# Patient Record
Sex: Male | Born: 1990 | Race: Black or African American | Hispanic: No | Marital: Single | Smoking: Current every day smoker
Health system: Southern US, Community
[De-identification: ages and names within clinical notes are randomized; demographics above are authoritative.]

---

## 2017-12-15 ENCOUNTER — Ambulatory Visit (HOSPITAL_COMMUNITY)
Admission: EM | Admit: 2017-12-15 | Discharge: 2017-12-15 | Disposition: A | Discharge status: Home / Self Care | Payer: Self-pay | Attending: Family Medicine | Admitting: Family Medicine

## 2017-12-15 ENCOUNTER — Encounter (HOSPITAL_COMMUNITY): Payer: Self-pay | Admitting: *Deleted

## 2017-12-15 DIAGNOSIS — F17210 Nicotine dependence, cigarettes, uncomplicated: Secondary | ICD-10-CM | POA: Insufficient documentation

## 2017-12-15 DIAGNOSIS — Z113 Encounter for screening for infections with a predominantly sexual mode of transmission: Secondary | ICD-10-CM

## 2017-12-15 DIAGNOSIS — Z8249 Family history of ischemic heart disease and other diseases of the circulatory system: Secondary | ICD-10-CM | POA: Insufficient documentation

## 2017-12-15 DIAGNOSIS — Z202 Contact with and (suspected) exposure to infections with a predominantly sexual mode of transmission: Secondary | ICD-10-CM | POA: Insufficient documentation

## 2017-12-15 MED ORDER — AZITHROMYCIN 250 MG PO TABS
1000.0000 mg | ORAL_TABLET | Freq: Once | ORAL | Status: AC
Start: 2017-12-15 — End: 2017-12-15
  Administered 2017-12-15: 1000 mg via ORAL

## 2017-12-15 MED ORDER — AZITHROMYCIN 250 MG PO TABS
ORAL_TABLET | ORAL | Status: AC
  Filled 2017-12-15: qty 4

## 2017-12-15 NOTE — ED Provider Notes (Signed)
[facility]    CSN: [id number] Arrival date & time: 12/15/17  1625     History   Chief Complaint Chief Complaint  Patient presents with  . Exposure to STD    HPI Alex Cunningham is a 27 y.o. male no significant past medical history presenting today for evaluation of STDs.  Patient states that his partner tested positive for chlamydia.  He denies any symptoms at this time.  Denies penile discharge, dysuria, increased frequency, abdominal pain, rashes or lesions.  He does note that approximately 2 months ago he had slight dysuria with urination that resolved on its own.  HPI  History reviewed. No pertinent past medical history.  There are no active problems to display for this patient.   History reviewed. No pertinent surgical history.     Home Medications    Prior to Admission medications   Not on File    Family History Family History  Problem Relation Age of Onset  . Hypertension Mother   . Hypertension Maternal Grandmother   . Hypertension Maternal Aunt     Social History Social History   Tobacco Use  . Smoking status: Current Every Day Smoker    Types: Cigarettes  . Smokeless tobacco: Never Used  Substance Use Topics  . Alcohol use: Yes    Comment: occasionally  . Drug use: Never     Allergies   Patient has no known allergies.   Review of Systems Review of Systems  Constitutional: Negative for fever.  HENT: Negative for sore throat.   Respiratory: Negative for shortness of breath.   Cardiovascular: Negative for chest pain.  Gastrointestinal: Negative for abdominal pain, nausea and vomiting.  Genitourinary: Negative for difficulty urinating, discharge, dysuria, frequency, penile pain, penile swelling, scrotal swelling and testicular pain.  Skin: Negative for rash.  Neurological: Negative for dizziness, light-headedness and headaches.     Physical Exam Triage Vital Signs ED Triage Vitals  Enc Vitals Group     BP 12/15/17 1656  138/78     Pulse Rate 12/15/17 1656 (!) 53     Resp 12/15/17 1656 14     Temp 12/15/17 1656 98.4 F (36.9 C)     Temp Source 12/15/17 1656 Oral     SpO2 12/15/17 1656 100 %     Weight --      Height --      Head Circumference --      Peak Flow --      Pain Score 12/15/17 1657 0     Pain Loc --      Pain Edu? --      Excl. in [place]? --    No data found.  Updated Vital Signs BP 138/78   Pulse (!) 53   Temp 98.4 F (36.9 C) (Oral)   Resp 14   SpO2 100%   Visual Acuity Right Eye Distance:   Left Eye Distance:   Bilateral Distance:    Right Eye Near:   Left Eye Near:    Bilateral Near:     Physical Exam  Constitutional: He is oriented to person, place, and time. He appears well-developed and well-nourished.  No acute distress  HENT:  Head: Normocephalic and atraumatic.  Nose: Nose normal.  Eyes: Conjunctivae are normal.  Neck: Neck supple.  Cardiovascular: Normal rate.  Pulmonary/Chest: Effort normal. No respiratory distress.  Abdominal: He exhibits no distension.  Musculoskeletal: Normal range of motion.  Neurological: He is alert and oriented to person, place, and time.  Skin: Skin is warm and dry.  Psychiatric: He has a normal mood and affect.  Nursing note and vitals reviewed.    UC Treatments / Results  Labs (all labs ordered are listed, but only abnormal results are displayed) Labs Reviewed  URINE CYTOLOGY ANCILLARY ONLY    EKG None  Radiology No results found.  Procedures Procedures (including critical care time)  Medications Ordered in UC Medications  azithromycin (ZITHROMAX) tablet 1,000 mg (has no administration in time range)    Initial Impression / Assessment and Plan / UC Course  I have reviewed the triage vital signs and the nursing notes.  Pertinent labs & imaging results that were available during my care of the patient were reviewed by me and considered in my medical decision making (see chart for details).     Urine cytology  obtained, will send off to check for STDs.  We will go ahead and empirically treat for chlamydia today.  Advised to refrain sexual activity for 7 days while infection being cleared.Discussed strict return precautions. Patient verbalized understanding and is agreeable with plan.  Final Clinical Impressions(s) / UC Diagnoses   Final diagnoses:  STD exposure     Discharge Instructions     We have treated you today for chlamydia, with azithromycin. Please refrain from sexual activity for 7 days while medicine is clearing infection.  We are testing you for Gonorrhea, Chlamydia and Trichomonas. We will call you if anything is positive and let you know if you require any further treatment. Please inform partner of any positive results.  Please return if symptoms not improving with treatment, development of fever, nausea, vomiting, abdominal pain, scrotal pain.    ED Prescriptions    None     Controlled Substance Prescriptions [employer] Controlled Substance Registry consulted? Not Applicable   Lew Dawes, [place] 12/15/17 1717

## 2017-12-15 NOTE — Discharge Instructions (Signed)
We have treated you today for chlamydia, with azithromycin. Please refrain from sexual activity for 7 days while medicine is clearing infection. ° °We are testing you for Gonorrhea, Chlamydia and Trichomonas. We will call you if anything is positive and let you know if you require any further treatment. Please inform partner of any positive results. ° °Please return if symptoms not improving with treatment, development of fever, nausea, vomiting, abdominal pain, scrotal pain. °

## 2017-12-15 NOTE — ED Triage Notes (Signed)
Reports was told he was possibly exposed to chlamydia.  Denies any sxs.

## 2017-12-18 ENCOUNTER — Telehealth (HOSPITAL_COMMUNITY): Payer: Self-pay

## 2017-12-18 LAB — URINE CYTOLOGY ANCILLARY ONLY
Chlamydia: POSITIVE — AB
Neisseria Gonorrhea: NEGATIVE
Trichomonas: NEGATIVE

## 2017-12-18 NOTE — Telephone Encounter (Signed)
Chlamydia is positive.  This was treated at the urgent care visit with po zithromax 1g.  Pt contacted and made aware of results. Educated pt to please refrain from sexual intercourse for 7 days to give the medicine time to work.  Sexual partners need to be notified and tested/treated.  Condoms may reduce risk of reinfection.  Recheck or followup with PCP for further evaluation if symptoms are not improving.  [facility] notified 

## 2017-12-19 LAB — URINE CYTOLOGY ANCILLARY ONLY
Bacterial vaginitis: NEGATIVE
Candida vaginitis: NEGATIVE

## 2019-04-29 ENCOUNTER — Ambulatory Visit (HOSPITAL_COMMUNITY)
Admission: EM | Admit: 2019-04-29 | Discharge: 2019-04-29 | Disposition: A | Discharge status: Home / Self Care | Payer: 59 | Attending: Family Medicine | Admitting: Family Medicine

## 2019-04-29 ENCOUNTER — Other Ambulatory Visit: Payer: Self-pay

## 2019-04-29 ENCOUNTER — Encounter (HOSPITAL_COMMUNITY): Payer: Self-pay

## 2019-04-29 DIAGNOSIS — R300 Dysuria: Secondary | ICD-10-CM | POA: Diagnosis not present

## 2019-04-29 LAB — POCT URINALYSIS DIP (DEVICE)
Bilirubin Urine: NEGATIVE
Glucose, UA: NEGATIVE mg/dL
Hgb urine dipstick: NEGATIVE
Ketones, ur: NEGATIVE mg/dL
Leukocytes,Ua: NEGATIVE
Nitrite: NEGATIVE
Protein, ur: NEGATIVE mg/dL
Specific Gravity, Urine: 1.025 (ref 1.005–1.030)
Urobilinogen, UA: 0.2 mg/dL (ref 0.0–1.0)
pH: 6 (ref 5.0–8.0)

## 2019-04-29 MED ORDER — AZITHROMYCIN 250 MG PO TABS
ORAL_TABLET | ORAL | Status: AC
  Filled 2019-04-29: qty 4

## 2019-04-29 MED ORDER — CEFTRIAXONE SODIUM 250 MG IJ SOLR
250.0000 mg | Freq: Once | INTRAMUSCULAR | Status: AC
Start: 2019-04-29 — End: 2019-04-29
  Administered 2019-04-29: 250 mg via INTRAMUSCULAR

## 2019-04-29 MED ORDER — LIDOCAINE HCL (PF) 1 % IJ SOLN
INTRAMUSCULAR | Status: AC
  Filled 2019-04-29: qty 2

## 2019-04-29 MED ORDER — CEFTRIAXONE SODIUM 250 MG IJ SOLR
INTRAMUSCULAR | Status: AC
  Filled 2019-04-29: qty 250

## 2019-04-29 MED ORDER — AZITHROMYCIN 250 MG PO TABS
1000.0000 mg | ORAL_TABLET | Freq: Once | ORAL | Status: AC
  Administered 2019-04-29: 1000 mg via ORAL

## 2019-04-29 NOTE — ED Triage Notes (Signed)
Pt states he has had burning with urination the past few days.

## 2019-04-29 NOTE — Discharge Instructions (Addendum)
Your urine did not show any infection.  We will test you for STDs and call you with any positive results.  Treated you today with medicine based on symptoms.  Follow up as needed for continued or worsening symptoms

## 2019-04-29 NOTE — ED Provider Notes (Signed)
[place]    CSN: [id number] Arrival date & time: 04/29/19  8416      History   Chief Complaint Chief Complaint  Patient presents with  . Urinary Tract Infection    HPI Moris Ratchford is a 28 y.o. male.   Patient is a 28 year old male who presents today with dysuria.  This is been intermittent over the past 3 days.  Currently sexually active, unprotected.  History of chlamydia.  Denies any abdominal pain, penile discharge, testicle pain, testicle swelling or rashes.        History reviewed. No pertinent past medical history.  There are no active problems to display for this patient.   History reviewed. No pertinent surgical history.     Home Medications    Prior to Admission medications   Not on File    Family History Family History  Problem Relation Age of Onset  . Hypertension Mother   . Hypertension Maternal Grandmother   . Hypertension Maternal Aunt     Social History Social History   Tobacco Use  . Smoking status: Current Every Day Smoker    Types: Cigarettes  . Smokeless tobacco: Never Used  Substance Use Topics  . Alcohol use: Yes    Comment: occasionally  . Drug use: Never     Allergies   Patient has no known allergies.   Review of Systems Review of Systems  Genitourinary: Negative for decreased urine volume, difficulty urinating, discharge, dysuria, enuresis, flank pain, frequency, genital sores, hematuria, penile pain, penile swelling, scrotal swelling, testicular pain and urgency.     Physical Exam Triage Vital Signs ED Triage Vitals  Enc Vitals Group     BP 04/29/19 0822 135/88     Pulse Rate 04/29/19 0822 (!) 58     Resp 04/29/19 0822 16     Temp 04/29/19 0822 97.8 F (36.6 C)     Temp Source 04/29/19 0822 Temporal     SpO2 04/29/19 0822 100 %     Weight --      Height --      Head Circumference --      Peak Flow --      Pain Score 04/29/19 0827 5     Pain Loc --      Pain Edu? --      Excl. in [place]? --     No data found.  Updated Vital Signs BP 135/88 (BP Location: Left Arm)   Pulse (!) 58   Temp 97.8 F (36.6 C) (Temporal)   Resp 16   SpO2 100%   Visual Acuity Right Eye Distance:   Left Eye Distance:   Bilateral Distance:    Right Eye Near:   Left Eye Near:    Bilateral Near:     Physical Exam Vitals signs and nursing note reviewed.  Constitutional:      Appearance: Normal appearance.  HENT:     Head: Normocephalic and atraumatic.     Nose: Nose normal.  Eyes:     Conjunctiva/sclera: Conjunctivae normal.  Neck:     Musculoskeletal: Normal range of motion.  Pulmonary:     Effort: Pulmonary effort is normal.  Abdominal:     Palpations: Abdomen is soft.     Tenderness: There is no abdominal tenderness.  Genitourinary:    Penis: Normal.   Musculoskeletal: Normal range of motion.  Skin:    General: Skin is warm and dry.  Neurological:     Mental Status: He is alert.  Psychiatric:        Mood and Affect: Mood normal.      UC Treatments / Results  Labs (all labs ordered are listed, but only abnormal results are displayed) Labs Reviewed  POCT URINALYSIS DIP (DEVICE)  CYTOLOGY, (ORAL, ANAL, URETHRAL) ANCILLARY ONLY    EKG   Radiology No results found.  Procedures Procedures (including critical care time)  Medications Ordered in UC Medications  cefTRIAXone (ROCEPHIN) injection 250 mg (has no administration in time range)  azithromycin (ZITHROMAX) tablet 1,000 mg (has no administration in time range)    Initial Impression / Assessment and Plan / UC Course  I have reviewed the triage vital signs and the nursing notes.  Pertinent labs & imaging results that were available during my care of the patient were reviewed by me and considered in my medical decision making (see chart for details).     Dysuria- urine negative for infection Sending swab for STD testing.  Treating prophylactic  based on symptoms.  Final Clinical Impressions(s) / UC  Diagnoses   Final diagnoses:  Dysuria     Discharge Instructions     Your urine did not show any infection.  We will test you for STDs and call you with any positive results.  Treated you today with medicine based on symptoms.  Follow up as needed for continued or worsening symptoms     ED Prescriptions    None     PDMP not reviewed this encounter.   Janace Aris, NP 04/29/19 [phone]

## 2019-04-30 LAB — CYTOLOGY, (ORAL, ANAL, URETHRAL) ANCILLARY ONLY
Chlamydia: NEGATIVE
Neisseria Gonorrhea: NEGATIVE
Trichomonas: NEGATIVE

## 2020-06-24 ENCOUNTER — Other Ambulatory Visit: Payer: 59

## 2020-06-24 DIAGNOSIS — Z20822 Contact with and (suspected) exposure to covid-19: Secondary | ICD-10-CM

## 2020-06-24 NOTE — Progress Notes (Signed)
nov 

## 2020-06-26 LAB — SARS-COV-2, NAA 2 DAY TAT

## 2020-06-26 LAB — NOVEL CORONAVIRUS, NAA: SARS-CoV-2, NAA: DETECTED — AB

## 2021-11-02 ENCOUNTER — Ambulatory Visit (HOSPITAL_COMMUNITY)
Admission: EM | Admit: 2021-11-02 | Discharge: 2021-11-02 | Disposition: A | Discharge status: Home / Self Care | Payer: 59 | Attending: Internal Medicine | Admitting: Internal Medicine

## 2021-11-02 ENCOUNTER — Other Ambulatory Visit: Payer: Self-pay

## 2021-11-02 ENCOUNTER — Encounter (HOSPITAL_COMMUNITY): Payer: Self-pay | Admitting: *Deleted

## 2021-11-02 ENCOUNTER — Ambulatory Visit (INDEPENDENT_AMBULATORY_CARE_PROVIDER_SITE_OTHER): Payer: 59

## 2021-11-02 DIAGNOSIS — M25512 Pain in left shoulder: Secondary | ICD-10-CM | POA: Diagnosis not present

## 2021-11-02 DIAGNOSIS — S4992XA Unspecified injury of left shoulder and upper arm, initial encounter: Secondary | ICD-10-CM

## 2021-11-02 MED ORDER — KETOROLAC TROMETHAMINE 30 MG/ML IJ SOLN
30.0000 mg | Freq: Once | INTRAMUSCULAR | Status: AC
  Administered 2021-11-02: 30 mg via INTRAMUSCULAR

## 2021-11-02 MED ORDER — KETOROLAC TROMETHAMINE 30 MG/ML IJ SOLN
INTRAMUSCULAR | Status: AC
  Filled 2021-11-02: qty 1

## 2021-11-02 NOTE — ED Triage Notes (Signed)
T reports while working on his car yesterday his shoulder dropped down and he has numbness to arm . Pt unable to raise Lt arm above head. ?

## 2021-11-02 NOTE — ED Provider Notes (Signed)
?[facility] ? ? ? ?CSN: [id number] ?Arrival date & time: 11/02/21  1218 ? ? ?  ? ?History   ?Chief Complaint ?Chief Complaint  ?Patient presents with  ? Shoulder Injury  ? ? ?HPI ?Alex Cunningham is a 31 y.o. male.  ? ?Patient presents with left shoulder pain that started yesterday after an injury.  Patient reports that he was working on his car and while twisting one of the bolts, his shoulder " slipped and dropped down" and he had subsequent pain.  Pain is present in the lateral portion as well as the anterior portion of the left shoulder.  Having difficulty with lifting arm up due to pain.  Also reports some mild numbness to area of arm that is affected. ? ? ?Shoulder Injury ? ? ?History reviewed. No pertinent past medical history. ? ?There are no problems to display for this patient. ? ? ?History reviewed. No pertinent surgical history. ? ? ? ? ?Home Medications   ? ?Prior to Admission medications   ?Not on File  ? ? ?Family History ?Family History  ?Problem Relation Age of Onset  ? Hypertension Mother   ? Hypertension Maternal Grandmother   ? Hypertension Maternal Aunt   ? ? ?Social History ?Social History  ? ?Tobacco Use  ? Smoking status: Every Day  ?  Types: Cigarettes  ? Smokeless tobacco: Never  ?Vaping Use  ? Vaping Use: Never used  ?Substance Use Topics  ? Alcohol use: Yes  ?  Comment: occasionally  ? Drug use: Never  ? ? ? ?Allergies   ?Patient has no known allergies. ? ? ?Review of Systems ?Review of Systems ?Per HPI ? ?Physical Exam ?Triage Vital Signs ?ED Triage Vitals  ?Enc Vitals Group  ?   BP 11/02/21 1229 137/90  ?   Pulse Rate 11/02/21 1229 90  ?   Resp 11/02/21 1229 18  ?   Temp 11/02/21 1229 98.1 ?F (36.7 ?C)  ?   Temp src --   ?   SpO2 11/02/21 1229 96 %  ?   Weight --   ?   Height --   ?   Head Circumference --   ?   Peak Flow --   ?   Pain Score 11/02/21 1227 7  ?   Pain Loc --   ?   Pain Edu? --   ?   Excl. in [place]? --   ? ?No data found. ? ?Updated Vital Signs ?BP 137/90   Pulse 90    Temp 98.1 ?F (36.7 ?C)   Resp 18   SpO2 96%  ? ?Visual Acuity ?Right Eye Distance:   ?Left Eye Distance:   ?Bilateral Distance:   ? ?Right Eye Near:   ?Left Eye Near:    ?Bilateral Near:    ? ?Physical Exam ?Constitutional:   ?   General: He is not in acute distress. ?   Appearance: Normal appearance. He is not toxic-appearing or diaphoretic.  ?HENT:  ?   Head: Normocephalic and atraumatic.  ?Eyes:  ?   Extraocular Movements: Extraocular movements intact.  ?   Conjunctiva/sclera: Conjunctivae normal.  ?Pulmonary:  ?   Effort: Pulmonary effort is normal.  ?Musculoskeletal:  ?   Comments: Tenderness to palpation to left lateral shoulder as well as anterior shoulder.  Pain with range of motion at 45 degrees with abduction.  Grip strength is 5/5.  Neurovascular intact.  No obvious swelling, discoloration, abrasions, lacerations noted.  ?Neurological:  ?  General: No focal deficit present.  ?   Mental Status: He is alert and oriented to person, place, and time. Mental status is at baseline.  ?Psychiatric:     ?   Mood and Affect: Mood normal.     ?   Behavior: Behavior normal.     ?   Thought Content: Thought content normal.     ?   Judgment: Judgment normal.  ? ? ? ?UC Treatments / Results  ?Labs ?(all labs ordered are listed, but only abnormal results are displayed) ?Labs Reviewed - No data to display ? ?EKG ? ? ?Radiology ?DG Shoulder Left ? ?Result Date: 11/02/2021 ?CLINICAL DATA:  Shoulder injury EXAM: LEFT SHOULDER - 2+ VIEW COMPARISON:  None Available. FINDINGS: There is no evidence of fracture or dislocation. There is no evidence of arthropathy or other focal bone abnormality. Soft tissues are unremarkable. IMPRESSION: Negative. Electronically Signed   By: Jacqulynn Cadet M.D.   On: 11/02/2021 13:16   ? ?Procedures ?Procedures (including critical care time) ? ?Medications Ordered in UC ?Medications  ?ketorolac (TORADOL) 30 MG/ML injection 30 mg (30 mg Intramuscular Given 11/02/21 1338)  ? ? ?Initial  Impression / Assessment and Plan / UC Course  ?I have reviewed the triage vital signs and the nursing notes. ? ?Pertinent labs & imaging results that were available during my care of the patient were reviewed by me and considered in my medical decision making (see chart for details). ? ?  ? ?Left shoulder x-ray was negative for any acute bony abnormality.  Suspect muscular strain/injury.  Discussed supportive care, over-the-counter pain relievers, ice application.  IM Toradol administered in urgent care today.  Patient to avoid NSAIDs for at least 24 hours following injection.  Patient will need to follow-up with orthopedist at provided contact info for further evaluation and management.  Do not think that emergent orthopedic referral is necessary at this time as patient is neurovascularly intact.  Discussed return precautions.  Patient verbalized understanding and was agreeable with plan. ?Final Clinical Impressions(s) / UC Diagnoses  ? ?Final diagnoses:  ?Acute pain of left shoulder  ?Injury of left shoulder, initial encounter  ? ? ? ?Discharge Instructions   ? ?  ?Your x-ray was negative for any acute bony abnormality.  Suspect muscular strain/injury.  Please follow-up with orthopedist for further evaluation and management.  You were given a shot today in urgent care for pain.  Please avoid taking any ibuprofen, Advil, Aleve for least 24 hours following the shot. ? ? ? ? ?ED Prescriptions   ?None ?  ? ?PDMP not reviewed this encounter. ?  ?Teodora Medici, [place] ?11/02/21 1340 ? ?

## 2021-11-02 NOTE — Discharge Instructions (Signed)
Your x-ray was negative for any acute bony abnormality.  Suspect muscular strain/injury.  Please follow-up with orthopedist for further evaluation and management.  You were given a shot today in urgent care for pain.  Please avoid taking any ibuprofen, Advil, Aleve for least 24 hours following the shot. ?

## 2024-01-24 IMAGING — DX DG SHOULDER 2+V*L*
4 series · 4 of 4 positions shown · non-contrast
Comparison: None Available.

CLINICAL DATA: Shoulder injury

EXAM:
LEFT SHOULDER - 2+ VIEW

[shoulder ap]
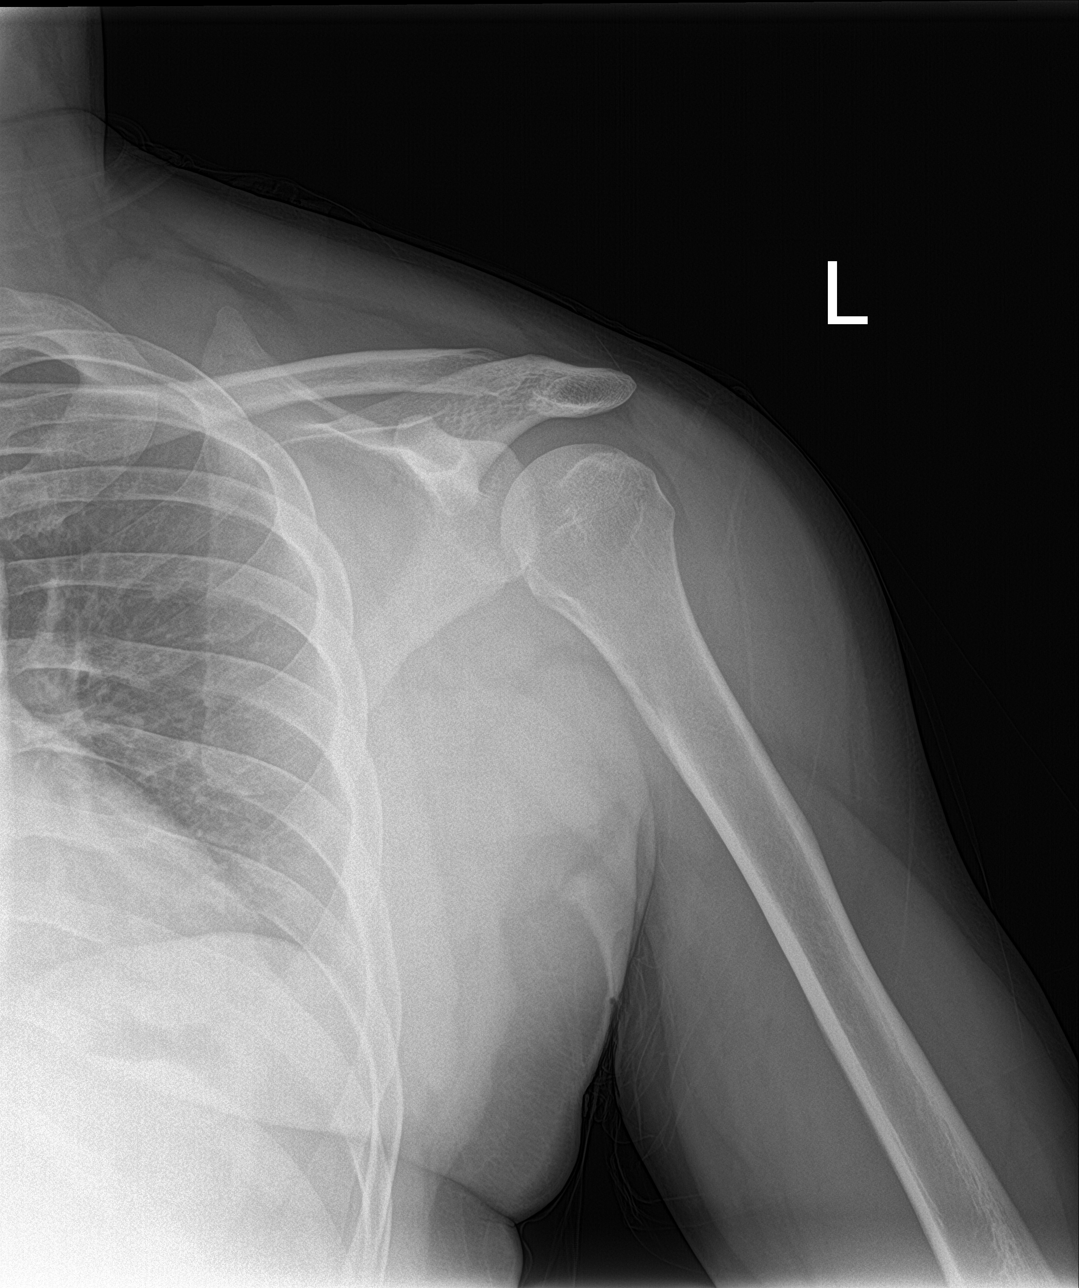

[shoulder grashey]
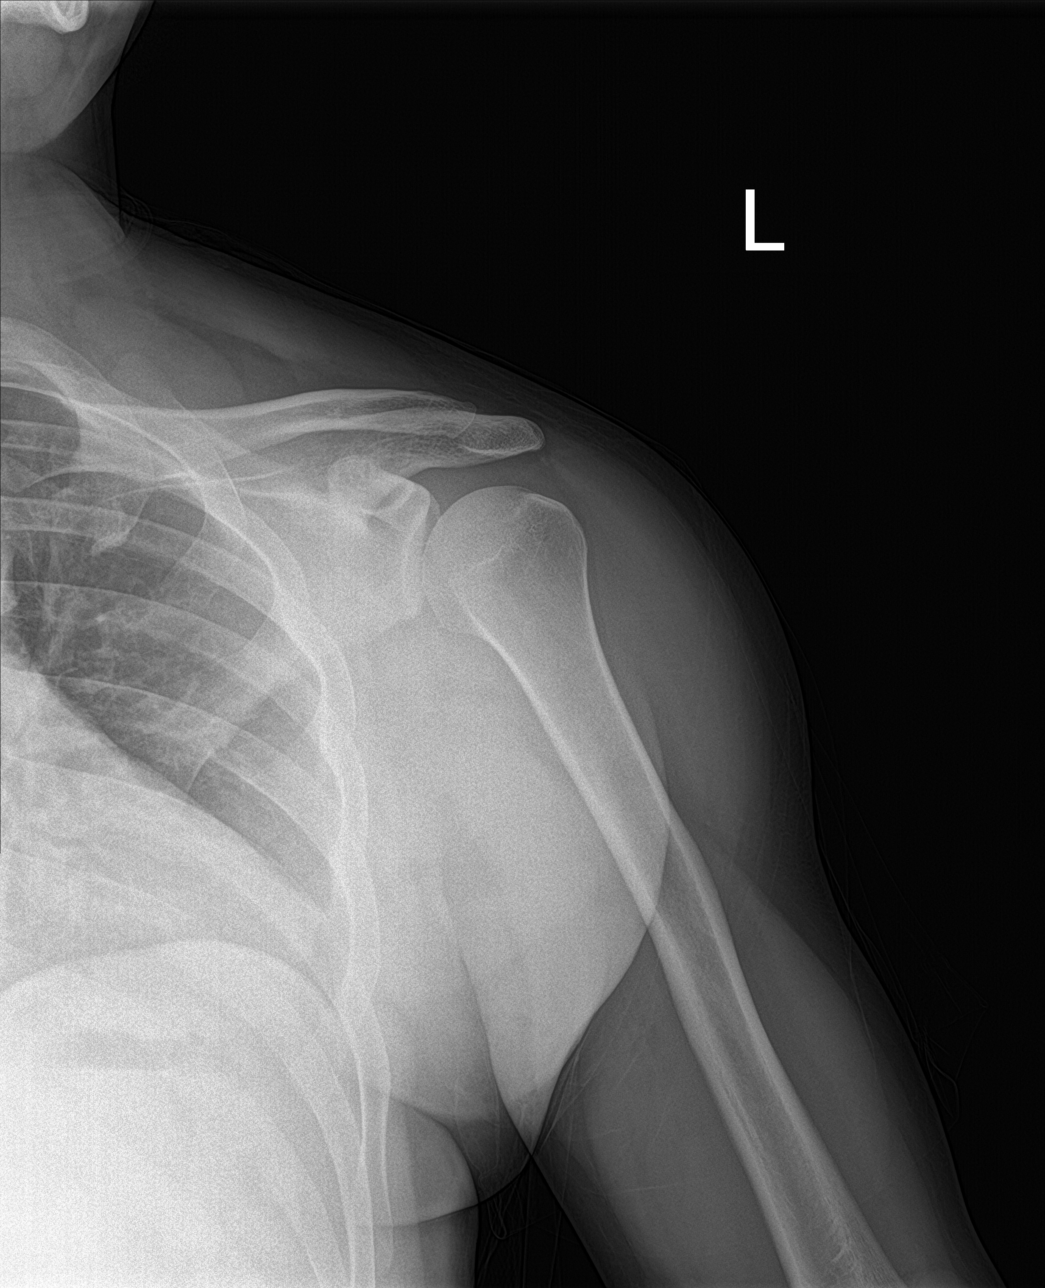

[shoulder y-view]
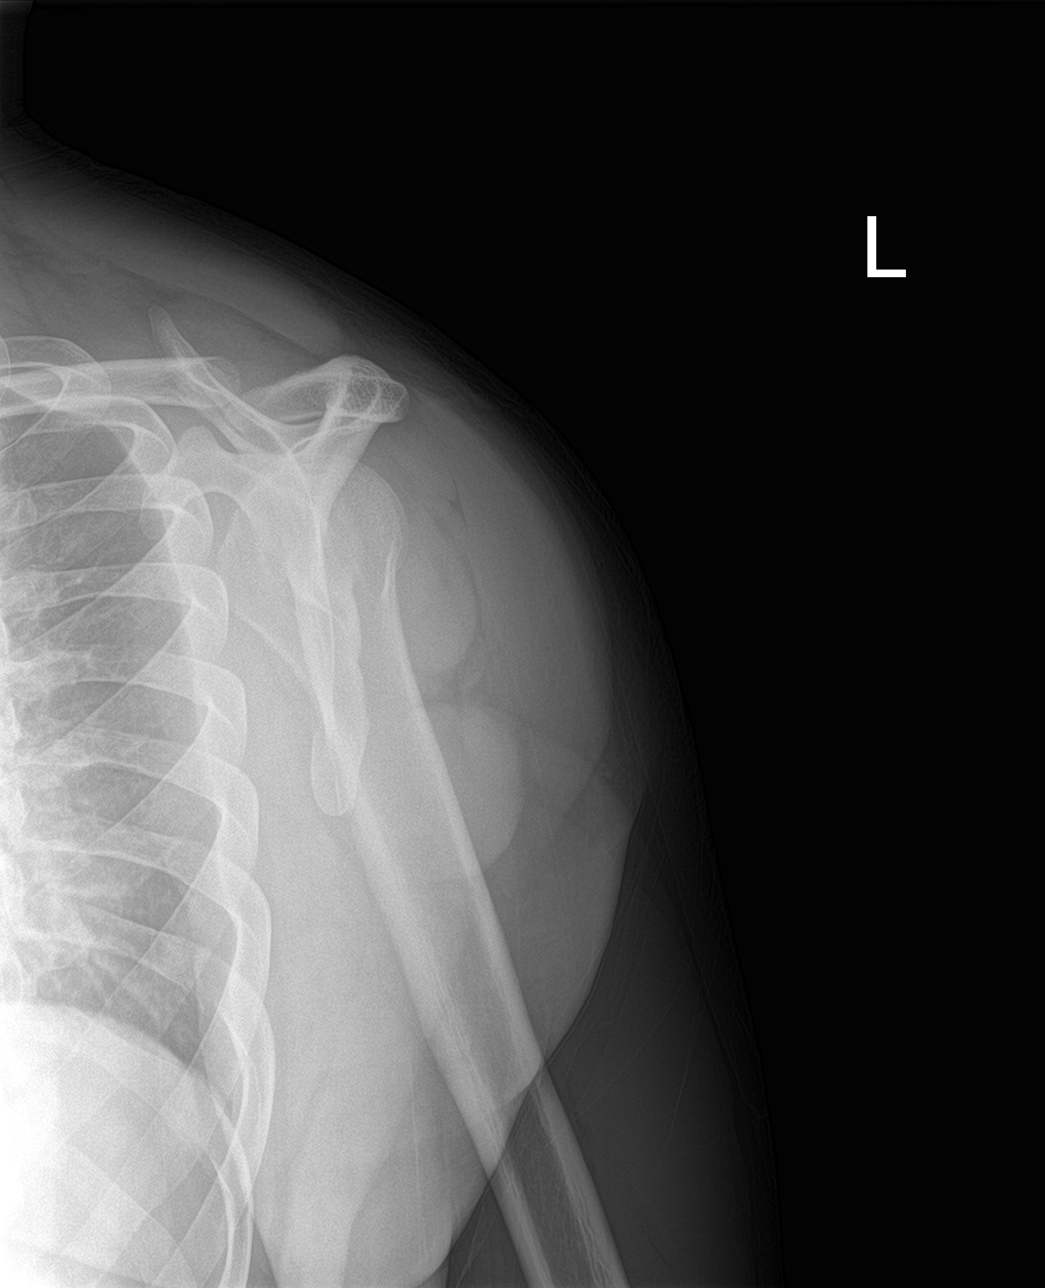

[shoulder axial]
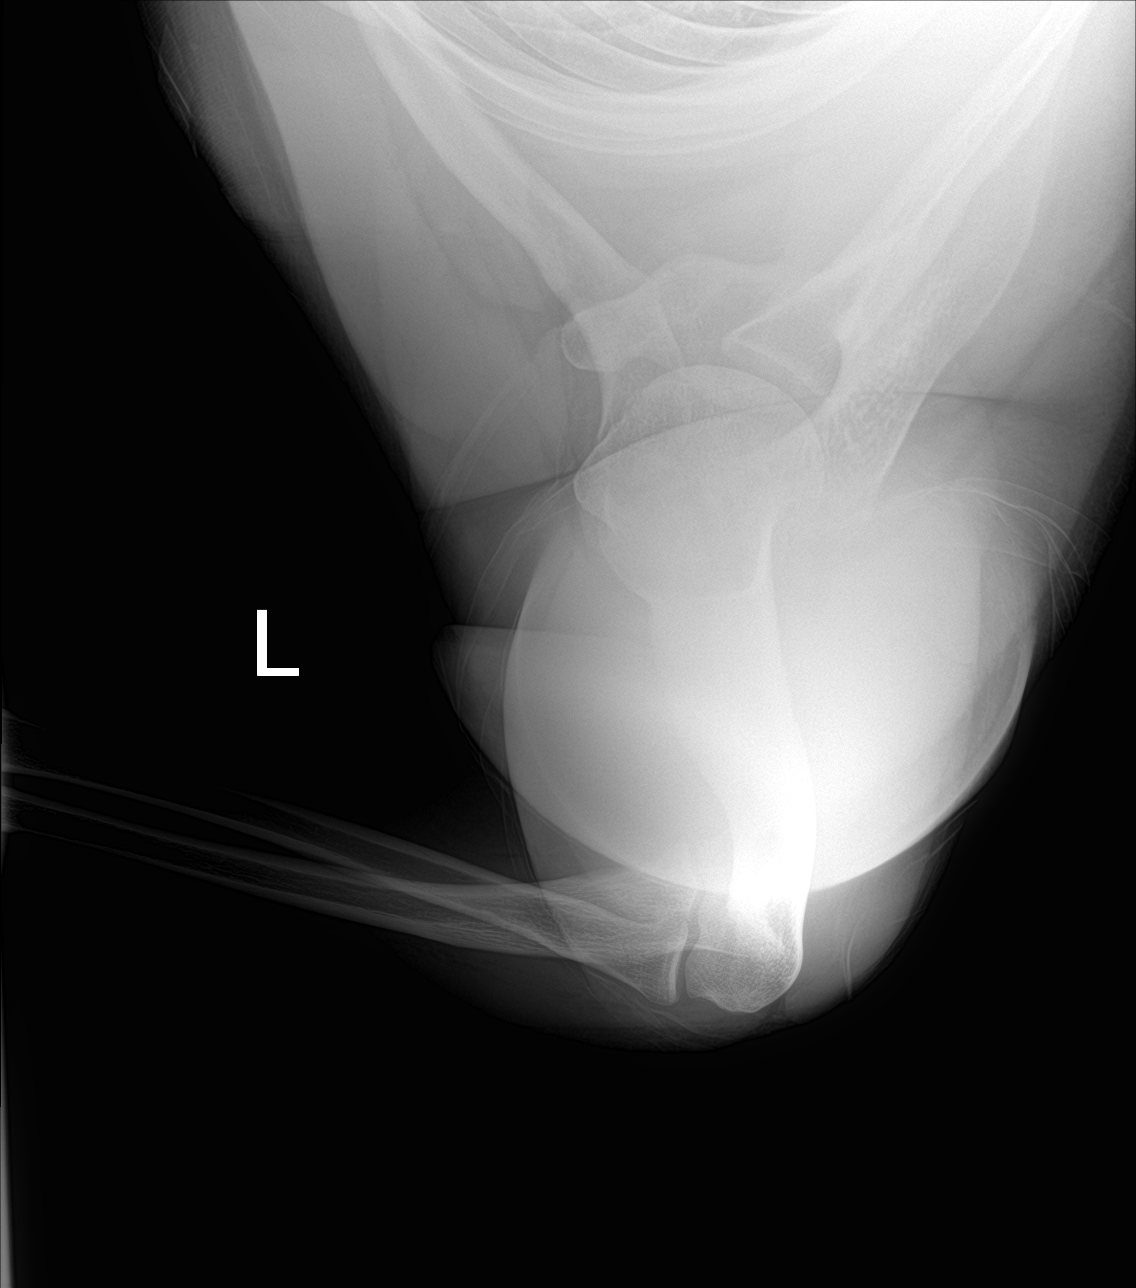

[4 of 4 positions shown; findings below may reference images not displayed]

FINDINGS: There is no evidence of fracture or dislocation. There is no
evidence of arthropathy or other focal bone abnormality. Soft
tissues are unremarkable.
IMPRESSION: Negative.
